# Patient Record
Sex: Female | Born: 1950 | Race: White | Hispanic: No | Marital: Single | Smoking: Never smoker
Health system: Southern US, Community
[De-identification: ages and names within clinical notes are randomized; demographics above are authoritative.]

## PROBLEM LIST (undated history)

## (undated) DIAGNOSIS — E039 Hypothyroidism, unspecified: Secondary | ICD-10-CM

## (undated) DIAGNOSIS — K219 Gastro-esophageal reflux disease without esophagitis: Secondary | ICD-10-CM

## (undated) DIAGNOSIS — IMO0001 Reserved for inherently not codable concepts without codable children: Secondary | ICD-10-CM

## (undated) DIAGNOSIS — F419 Anxiety disorder, unspecified: Secondary | ICD-10-CM

## (undated) DIAGNOSIS — D649 Anemia, unspecified: Secondary | ICD-10-CM

## (undated) DIAGNOSIS — D2371 Other benign neoplasm of skin of right lower limb, including hip: Secondary | ICD-10-CM

## (undated) DIAGNOSIS — F32A Depression, unspecified: Secondary | ICD-10-CM

## (undated) DIAGNOSIS — I10 Essential (primary) hypertension: Secondary | ICD-10-CM

## (undated) DIAGNOSIS — F329 Major depressive disorder, single episode, unspecified: Secondary | ICD-10-CM

## (undated) HISTORY — PX: WISDOM TOOTH EXTRACTION: SHX21

## (undated) HISTORY — DX: Other benign neoplasm of skin of right lower limb, including hip: D23.71

## (undated) HISTORY — PX: ABDOMINAL HYSTERECTOMY: SHX81

## (undated) HISTORY — PX: TONSILLECTOMY: SUR1361

---

## 2007-01-09 ENCOUNTER — Ambulatory Visit: Payer: Self-pay

## 2008-04-01 ENCOUNTER — Ambulatory Visit: Payer: Self-pay | Admitting: Family Medicine

## 2009-07-14 ENCOUNTER — Ambulatory Visit: Payer: Self-pay

## 2009-09-04 ENCOUNTER — Emergency Department: Payer: Self-pay | Admitting: Emergency Medicine

## 2009-09-07 ENCOUNTER — Emergency Department: Payer: Self-pay | Admitting: Emergency Medicine

## 2010-09-17 ENCOUNTER — Emergency Department: Payer: Self-pay | Admitting: Unknown Physician Specialty

## 2010-10-12 ENCOUNTER — Ambulatory Visit: Payer: Self-pay

## 2011-02-15 ENCOUNTER — Ambulatory Visit: Payer: Self-pay | Admitting: Gastroenterology

## 2011-02-17 LAB — PATHOLOGY REPORT

## 2011-10-16 ENCOUNTER — Ambulatory Visit: Payer: Self-pay | Admitting: Nurse Practitioner

## 2012-12-02 ENCOUNTER — Ambulatory Visit: Payer: Self-pay

## 2014-03-12 ENCOUNTER — Ambulatory Visit: Payer: Self-pay | Admitting: Family Medicine

## 2014-09-01 ENCOUNTER — Other Ambulatory Visit: Payer: Self-pay | Admitting: Otolaryngology

## 2014-09-01 DIAGNOSIS — E041 Nontoxic single thyroid nodule: Secondary | ICD-10-CM

## 2014-09-02 ENCOUNTER — Other Ambulatory Visit: Payer: Self-pay | Admitting: Physician Assistant

## 2014-09-03 ENCOUNTER — Ambulatory Visit
Admission: RE | Admit: 2014-09-03 | Discharge: 2014-09-03 | Disposition: A | Discharge status: Home / Self Care | Payer: Medicare Other | Source: Ambulatory Visit | Attending: Otolaryngology | Admitting: Otolaryngology

## 2014-09-03 DIAGNOSIS — E041 Nontoxic single thyroid nodule: Secondary | ICD-10-CM | POA: Insufficient documentation

## 2014-09-03 NOTE — Procedures (Signed)
Interventional Radiology Procedure Note  Procedure:  Left thyroid FNA biopsy  Complications: None  Estimated Blood Loss: <10 mL  Dominant 1.8 cm left thyroid nodule sampled w/ 25 G FNA x 4.  Venetia Night. Kathlene Cote, M.D Pager:  [phone]

## 2014-09-07 LAB — CYTOLOGY - NON PAP

## 2014-11-19 ENCOUNTER — Inpatient Hospital Stay: Admission: RE | Admit: 2014-11-19 | Payer: Medicare Other | Source: Ambulatory Visit

## 2014-11-19 ENCOUNTER — Encounter
Admission: RE | Admit: 2014-11-19 | Discharge: 2014-11-19 | Disposition: A | Discharge status: Home / Self Care | Payer: Medicare Other | Source: Ambulatory Visit | Attending: Otolaryngology | Admitting: Otolaryngology

## 2014-11-19 DIAGNOSIS — E041 Nontoxic single thyroid nodule: Secondary | ICD-10-CM | POA: Insufficient documentation

## 2014-11-19 DIAGNOSIS — Z01818 Encounter for other preprocedural examination: Secondary | ICD-10-CM | POA: Insufficient documentation

## 2014-11-19 HISTORY — DX: Reserved for inherently not codable concepts without codable children: IMO0001

## 2014-11-19 HISTORY — DX: Anemia, unspecified: D64.9

## 2014-11-19 HISTORY — DX: Gastro-esophageal reflux disease without esophagitis: K21.9

## 2014-11-19 LAB — CBC
HCT: 41.9 % (ref 35.0–47.0)
HEMOGLOBIN: 14.3 g/dL (ref 12.0–16.0)
MCH: 28.5 pg (ref 26.0–34.0)
MCHC: 34.2 g/dL (ref 32.0–36.0)
MCV: 83.3 fL (ref 80.0–100.0)
Platelets: 192 10*3/uL (ref 150–440)
RBC: 5.03 MIL/uL (ref 3.80–5.20)
RDW: 14 % (ref 11.5–14.5)
WBC: 6.3 10*3/uL (ref 3.6–11.0)

## 2014-11-19 LAB — POTASSIUM: Potassium: 3.3 mmol/L — ABNORMAL LOW (ref 3.5–5.1)

## 2014-11-19 NOTE — Patient Instructions (Signed)
  Your procedure is scheduled on: December 03, 2014 (Thursday)   Report to Day Surgery.[facility]) To find out your arrival time please call [phone] between 1PM - 3PM on December 02, 2014(Wednesday).  Remember: Instructions that are not followed completely may result in serious medical risk, up to and including death, or upon the discretion of your surgeon and anesthesiologist your surgery may need to be rescheduled.    __x__ 1. Do not eat food or drink liquids after midnight. No gum chewing or hard candies.     ____ 2. No Alcohol for 24 hours before or after surgery.   ____ 3. Bring all medications with you on the day of surgery if instructed.    __x__ 4. Notify your doctor if there is any change in your medical condition     (cold, fever, infections).     Do not wear jewelry, make-up, hairpins, clips or nail polish.  Do not wear lotions, powders, or perfumes. You may wear deodorant.  Do not shave 48 hours prior to surgery. Men may shave face and neck.  Do not bring valuables to the hospital.    [facility] is not responsible for any belongings or valuables.               Contacts, dentures or bridgework may not be worn into surgery.  Leave your suitcase in the car. After surgery it may be brought to your room.  For patients admitted to the hospital, discharge time is determined by your                treatment team.   Patients discharged the day of surgery will not be allowed to drive home.   Please read over the following fact sheets that you were given:      __x__ Take these medicines the morning of surgery with A SIP OF WATER:    1. Ranitidine (Ranitidine at bedtime the night before surgery)   2.   3.   4.  5.  6.  ____ Fleet Enema (as directed)   ____ Use CHG Soap as directed  __x__ Use inhalers on the day of surgery(Use Proventil inhaler the day of surgery and bring to hospital )  ____ Stop metformin 2 days prior to surgery    ____ Take 1/2 of usual  insulin dose the night before surgery and none on the morning of surgery.   __x__ Stop Coumadin/Plavix/aspirin on (Stop Aspirin one week before surgery)  __x_ Stop Anti-inflammatories on (Tylenol ok to take for pain)   __x__ Stop supplements until after surgery.  (Stop Vitamin C, Vitamin E, and Fish Oil NOW)  ____ Bring C-Pap to the hospital.

## 2014-11-20 NOTE — OR Nursing (Signed)
Kt 3.3 called to Dr Pryor Ochoa office and spoke to [place]

## 2014-12-03 ENCOUNTER — Observation Stay
Admission: RE | Admit: 2014-12-03 | Discharge: 2014-12-04 | Disposition: A | Discharge status: Home / Self Care | Payer: Medicare Other | Source: Ambulatory Visit | Attending: Otolaryngology | Admitting: Otolaryngology

## 2014-12-03 ENCOUNTER — Observation Stay
Admission: RE | Admit: 2014-12-03 | Discharge: 2014-12-03 | Disposition: A | Discharge status: Home / Self Care | Payer: Medicare Other | Source: Ambulatory Visit | Attending: Internal Medicine | Admitting: Internal Medicine

## 2014-12-03 ENCOUNTER — Encounter: Payer: Self-pay | Admitting: *Deleted

## 2014-12-03 ENCOUNTER — Ambulatory Visit: Payer: Medicare Other | Admitting: Anesthesiology

## 2014-12-03 ENCOUNTER — Encounter
Admission: RE | Disposition: A | Discharge status: Home / Self Care | Payer: Self-pay | Source: Ambulatory Visit | Attending: Otolaryngology

## 2014-12-03 DIAGNOSIS — E041 Nontoxic single thyroid nodule: Secondary | ICD-10-CM | POA: Diagnosis present

## 2014-12-03 DIAGNOSIS — Z881 Allergy status to other antibiotic agents status: Secondary | ICD-10-CM | POA: Diagnosis not present

## 2014-12-03 DIAGNOSIS — R079 Chest pain, unspecified: Secondary | ICD-10-CM

## 2014-12-03 DIAGNOSIS — Z880 Allergy status to penicillin: Secondary | ICD-10-CM | POA: Insufficient documentation

## 2014-12-03 DIAGNOSIS — Z8349 Family history of other endocrine, nutritional and metabolic diseases: Secondary | ICD-10-CM | POA: Diagnosis not present

## 2014-12-03 DIAGNOSIS — E890 Postprocedural hypothyroidism: Secondary | ICD-10-CM | POA: Diagnosis not present

## 2014-12-03 DIAGNOSIS — K219 Gastro-esophageal reflux disease without esophagitis: Secondary | ICD-10-CM | POA: Insufficient documentation

## 2014-12-03 DIAGNOSIS — Z79899 Other long term (current) drug therapy: Secondary | ICD-10-CM | POA: Diagnosis not present

## 2014-12-03 DIAGNOSIS — Z7982 Long term (current) use of aspirin: Secondary | ICD-10-CM | POA: Insufficient documentation

## 2014-12-03 DIAGNOSIS — Z9889 Other specified postprocedural states: Secondary | ICD-10-CM | POA: Diagnosis not present

## 2014-12-03 DIAGNOSIS — Z809 Family history of malignant neoplasm, unspecified: Secondary | ICD-10-CM | POA: Diagnosis not present

## 2014-12-03 DIAGNOSIS — R9431 Abnormal electrocardiogram [ECG] [EKG]: Secondary | ICD-10-CM | POA: Diagnosis not present

## 2014-12-03 DIAGNOSIS — C73 Malignant neoplasm of thyroid gland: Secondary | ICD-10-CM | POA: Diagnosis not present

## 2014-12-03 DIAGNOSIS — Z7951 Long term (current) use of inhaled steroids: Secondary | ICD-10-CM | POA: Diagnosis not present

## 2014-12-03 HISTORY — PX: THYROIDECTOMY: SHX17

## 2014-12-03 LAB — POCT I-STAT 4, (NA,K, GLUC, HGB,HCT)
GLUCOSE: 95 mg/dL (ref 65–99)
HCT: 42 % (ref 36.0–46.0)
Hemoglobin: 14.3 g/dL (ref 12.0–15.0)
POTASSIUM: 3.5 mmol/L (ref 3.5–5.1)
Sodium: 140 mmol/L (ref 135–145)

## 2014-12-03 LAB — CALCIUM
CALCIUM: 8.3 mg/dL — AB (ref 8.9–10.3)
Calcium: 8.6 mg/dL — ABNORMAL LOW (ref 8.9–10.3)

## 2014-12-03 LAB — MAGNESIUM: MAGNESIUM: 1.9 mg/dL (ref 1.7–2.4)

## 2014-12-03 LAB — ALBUMIN: ALBUMIN: 4.1 g/dL (ref 3.5–5.0)

## 2014-12-03 SURGERY — THYROIDECTOMY
Anesthesia: General | Site: Neck | Wound class: Clean Contaminated

## 2014-12-03 MED ORDER — SODIUM CHLORIDE 0.9 % IR SOLN
Status: DC | PRN
  Administered 2014-12-03: 60 mL

## 2014-12-03 MED ORDER — LISINOPRIL 5 MG PO TABS: 5.0000 mg | ORAL_TABLET | Freq: Every day | ORAL | Status: DC

## 2014-12-03 MED ORDER — FERROUS SULFATE 325 (65 FE) MG PO TABS
325.0000 mg | ORAL_TABLET | Freq: Every day | ORAL | Status: DC
  Administered 2014-12-04: 325 mg via ORAL
  Filled 2014-12-03: qty 1

## 2014-12-03 MED ORDER — MEPERIDINE HCL 25 MG/ML IJ SOLN: 6.2500 mg | INTRAMUSCULAR | Status: DC | PRN

## 2014-12-03 MED ORDER — FLUTICASONE PROPIONATE 50 MCG/ACT NA SUSP
2.0000 | Freq: Every day | NASAL | Status: DC
  Administered 2014-12-04: 2 via NASAL
  Filled 2014-12-03: qty 16

## 2014-12-03 MED ORDER — EPHEDRINE SULFATE 50 MG/ML IJ SOLN
INTRAMUSCULAR | Status: DC | PRN
  Administered 2014-12-03: 5 mg via INTRAVENOUS

## 2014-12-03 MED ORDER — LORATADINE 10 MG PO TABS
10.0000 mg | ORAL_TABLET | Freq: Every day | ORAL | Status: DC
  Administered 2014-12-04: 10 mg via ORAL
  Filled 2014-12-03: qty 1

## 2014-12-03 MED ORDER — LISINOPRIL-HYDROCHLOROTHIAZIDE 10-12.5 MG PO TABS: 0.5000 | ORAL_TABLET | Freq: Every day | ORAL | Status: DC

## 2014-12-03 MED ORDER — PHENYLEPHRINE HCL 10 MG/ML IJ SOLN
INTRAMUSCULAR | Status: DC | PRN
  Administered 2014-12-03 (×2): 100 ug via INTRAVENOUS

## 2014-12-03 MED ORDER — BUPIVACAINE-EPINEPHRINE (PF) 0.25% -1:200000 IJ SOLN
INTRAMUSCULAR | Status: AC
  Filled 2014-12-03: qty 30

## 2014-12-03 MED ORDER — BACITRACIN 50000 UNITS IM SOLR
INTRAMUSCULAR | Status: AC
  Filled 2014-12-03: qty 1

## 2014-12-03 MED ORDER — ACETAMINOPHEN 160 MG/5ML PO SOLN: 650.0000 mg | ORAL | Status: DC | PRN

## 2014-12-03 MED ORDER — CALCIUM CARBONATE-VITAMIN D 500-200 MG-UNIT PO TABS
2.0000 | ORAL_TABLET | Freq: Three times a day (TID) | ORAL | Status: DC
  Administered 2014-12-03 – 2014-12-04 (×3): 2 via ORAL
  Filled 2014-12-03 (×3): qty 2

## 2014-12-03 MED ORDER — LIDOCAINE HCL (CARDIAC) 20 MG/ML IV SOLN
INTRAVENOUS | Status: DC | PRN
  Administered 2014-12-03: 60 mg via INTRAVENOUS

## 2014-12-03 MED ORDER — LISINOPRIL 5 MG PO TABS
5.0000 mg | ORAL_TABLET | Freq: Every day | ORAL | Status: DC
  Administered 2014-12-04: 5 mg via ORAL
  Filled 2014-12-03: qty 1

## 2014-12-03 MED ORDER — ROCURONIUM BROMIDE 100 MG/10ML IV SOLN
INTRAVENOUS | Status: DC | PRN
  Administered 2014-12-03: 5 mg via INTRAVENOUS

## 2014-12-03 MED ORDER — OXYCODONE HCL 5 MG/5ML PO SOLN: 5.0000 mg | ORAL | Status: DC | PRN

## 2014-12-03 MED ORDER — FENTANYL CITRATE (PF) 100 MCG/2ML IJ SOLN: 25.0000 ug | INTRAMUSCULAR | Status: DC | PRN

## 2014-12-03 MED ORDER — ACETAMINOPHEN 650 MG RE SUPP: 650.0000 mg | RECTAL | Status: DC | PRN

## 2014-12-03 MED ORDER — MIDAZOLAM HCL 2 MG/2ML IJ SOLN
INTRAMUSCULAR | Status: DC | PRN
Start: 2014-12-03 — End: 2014-12-03
  Administered 2014-12-03: 2 mg via INTRAVENOUS

## 2014-12-03 MED ORDER — HYDROCHLOROTHIAZIDE 10 MG/ML ORAL SUSPENSION: 6.2500 mg | Freq: Every day | ORAL | Status: DC

## 2014-12-03 MED ORDER — BACITRACIN ZINC 500 UNIT/GM EX OINT
1.0000 "application " | TOPICAL_OINTMENT | Freq: Three times a day (TID) | CUTANEOUS | Status: DC
  Administered 2014-12-03: 1 via TOPICAL
  Administered 2014-12-03: 15.5556 via TOPICAL
  Administered 2014-12-04: 1 via TOPICAL
  Filled 2014-12-03: qty 28.35

## 2014-12-03 MED ORDER — LACTATED RINGERS IV SOLN
INTRAVENOUS | Status: DC | PRN
  Administered 2014-12-03: 07:00:00 via INTRAVENOUS

## 2014-12-03 MED ORDER — FAMOTIDINE 20 MG PO TABS
20.0000 mg | ORAL_TABLET | Freq: Two times a day (BID) | ORAL | Status: DC
  Administered 2014-12-03 – 2014-12-04 (×2): 20 mg via ORAL
  Filled 2014-12-03 (×2): qty 1

## 2014-12-03 MED ORDER — METOCLOPRAMIDE HCL 5 MG/ML IJ SOLN
10.0000 mg | Freq: Once | INTRAMUSCULAR | Status: AC
  Administered 2014-12-03: 10 mg via INTRAVENOUS

## 2014-12-03 MED ORDER — ONDANSETRON HCL 4 MG/2ML IJ SOLN
INTRAMUSCULAR | Status: DC | PRN
  Administered 2014-12-03: 4 mg via INTRAVENOUS

## 2014-12-03 MED ORDER — PROPOFOL 10 MG/ML IV BOLUS
INTRAVENOUS | Status: DC | PRN
  Administered 2014-12-03: 150 mg via INTRAVENOUS

## 2014-12-03 MED ORDER — METOCLOPRAMIDE HCL 5 MG/ML IJ SOLN
INTRAMUSCULAR | Status: AC
  Administered 2014-12-03: 10 mg via INTRAVENOUS
  Filled 2014-12-03: qty 2

## 2014-12-03 MED ORDER — DEXTROSE-NACL 5-0.45 % IV SOLN
INTRAVENOUS | Status: DC
  Administered 2014-12-03 – 2014-12-04 (×3): via INTRAVENOUS

## 2014-12-03 MED ORDER — LISINOPRIL-HYDROCHLOROTHIAZIDE 10-12.5 MG PO TABS
0.5000 | ORAL_TABLET | Freq: Every day | ORAL | Status: DC
  Filled 2014-12-03: qty 1

## 2014-12-03 MED ORDER — ONDANSETRON HCL 4 MG/2ML IJ SOLN
4.0000 mg | INTRAMUSCULAR | Status: DC | PRN
  Administered 2014-12-03 (×2): 4 mg via INTRAVENOUS
  Filled 2014-12-03 (×2): qty 2

## 2014-12-03 MED ORDER — BACITRACIN 500 UNIT/GM EX OINT
TOPICAL_OINTMENT | CUTANEOUS | Status: DC | PRN
  Administered 2014-12-03: 1 via TOPICAL

## 2014-12-03 MED ORDER — SUCCINYLCHOLINE CHLORIDE 20 MG/ML IJ SOLN
INTRAMUSCULAR | Status: DC | PRN
  Administered 2014-12-03: 80 mg via INTRAVENOUS

## 2014-12-03 MED ORDER — FENTANYL CITRATE (PF) 100 MCG/2ML IJ SOLN
INTRAMUSCULAR | Status: DC | PRN
  Administered 2014-12-03 (×4): 50 ug via INTRAVENOUS

## 2014-12-03 MED ORDER — BACITRACIN ZINC 500 UNIT/GM EX OINT
TOPICAL_OINTMENT | CUTANEOUS | Status: AC
  Filled 2014-12-03: qty 0.9

## 2014-12-03 MED ORDER — DOCUSATE SODIUM 100 MG PO CAPS
100.0000 mg | ORAL_CAPSULE | Freq: Two times a day (BID) | ORAL | Status: DC
  Administered 2014-12-03 – 2014-12-04 (×2): 100 mg via ORAL
  Filled 2014-12-03 (×2): qty 1

## 2014-12-03 MED ORDER — MORPHINE SULFATE (PF) 2 MG/ML IV SOLN: 2.0000 mg | INTRAVENOUS | Status: DC | PRN

## 2014-12-03 MED ORDER — DEXAMETHASONE SODIUM PHOSPHATE 4 MG/ML IJ SOLN
INTRAMUSCULAR | Status: DC | PRN
  Administered 2014-12-03: 5 mg via INTRAVENOUS

## 2014-12-03 MED ORDER — BUPIVACAINE-EPINEPHRINE (PF) 0.25% -1:200000 IJ SOLN
INTRAMUSCULAR | Status: DC | PRN
  Administered 2014-12-03: 5 mL

## 2014-12-03 MED ORDER — ALBUTEROL SULFATE (2.5 MG/3ML) 0.083% IN NEBU: 3.0000 mL | INHALATION_SOLUTION | RESPIRATORY_TRACT | Status: DC | PRN

## 2014-12-03 MED ORDER — ONDANSETRON HCL 4 MG PO TABS: 4.0000 mg | ORAL_TABLET | ORAL | Status: DC | PRN

## 2014-12-03 MED ORDER — HYDROCHLOROTHIAZIDE 10 MG/ML ORAL SUSPENSION
6.2500 mg | Freq: Every day | ORAL | Status: DC
  Filled 2014-12-03 (×2): qty 1.25

## 2014-12-03 SURGICAL SUPPLY — 36 items
BLADE SURG 15 STRL LF DISP TIS (BLADE) ×1 IMPLANT
BLADE SURG 15 STRL SS (BLADE) ×2
CANISTER SUCT 1200ML W/VALVE (MISCELLANEOUS) ×3 IMPLANT
CLOSURE WOUND 1/4X4 (GAUZE/BANDAGES/DRESSINGS)
CORD BIP STRL DISP 12FT (MISCELLANEOUS) ×3 IMPLANT
DRAIN TLS ROUND 10FR (DRAIN) IMPLANT
DRAPE MAG INST 16X20 L/F (DRAPES) ×3 IMPLANT
DRSG TEGADERM 2-3/8X2-3/4 SM (GAUZE/BANDAGES/DRESSINGS) IMPLANT
ELECT LARYNGEAL 6/7 (MISCELLANEOUS)
ELECT LARYNGEAL 8/9 (MISCELLANEOUS) ×3
ELECTRODE LARYNGEAL 6/7 (MISCELLANEOUS) IMPLANT
ELECTRODE LARYNGEAL 8/9 (MISCELLANEOUS) ×1 IMPLANT
FORCEPS JEWEL BIP 4-3/4 STR (INSTRUMENTS) ×3 IMPLANT
GLOVE BIO SURGEON STRL SZ7.5 (GLOVE) ×15 IMPLANT
GOWN STRL REUS W/ TWL LRG LVL3 (GOWN DISPOSABLE) ×5 IMPLANT
GOWN STRL REUS W/TWL LRG LVL3 (GOWN DISPOSABLE) ×10
HARMONIC SCALPEL FOCUS (MISCELLANEOUS) ×3 IMPLANT
HEMOSTAT SURGICEL 2X3 (HEMOSTASIS) ×3 IMPLANT
HOOK STAY 5M SHARP BLUNT 3316- (MISCELLANEOUS) IMPLANT
KIT RM TURNOVER STRD PROC AR (KITS) ×3 IMPLANT
LABEL OR SOLS (LABEL) IMPLANT
LIQUID BAND (GAUZE/BANDAGES/DRESSINGS) IMPLANT
NS IRRIG 500ML POUR BTL (IV SOLUTION) ×3 IMPLANT
PACK HEAD/NECK (MISCELLANEOUS) ×3 IMPLANT
PAD GROUND ADULT SPLIT (MISCELLANEOUS) ×3 IMPLANT
PROBE NEUROSIGN BIPOL (MISCELLANEOUS) ×1 IMPLANT
PROBE NEUROSIGN BIPOLAR (MISCELLANEOUS) ×2
SPONGE KITTNER 5P (MISCELLANEOUS) ×9 IMPLANT
SPONGE XRAY 4X4 16PLY STRL (MISCELLANEOUS) ×6 IMPLANT
STRIP CLOSURE SKIN 1/4X4 (GAUZE/BANDAGES/DRESSINGS) IMPLANT
SUT PROLENE 6 0 P 1 18 (SUTURE) IMPLANT
SUT SILK 2 0 (SUTURE) ×2
SUT SILK 2 0 SH (SUTURE) IMPLANT
SUT SILK 2-0 18XBRD TIE 12 (SUTURE) ×1 IMPLANT
SUT VIC AB 4-0 RB1 18 (SUTURE) ×3 IMPLANT
SYSTEM CHEST DRAIN TLS 7FR (DRAIN) IMPLANT

## 2014-12-03 NOTE — Progress Notes (Signed)
..   12/03/2014 12:21 PM  Caitlin Hayes [id number]  Post-Op Day 0    Temp:  [97 F (36.1 C)-98 F (36.7 C)] 97 F (36.1 C) (10/27 1059) Pulse Rate:  [66-94] 81 (10/27 1044) Resp:  [12-18] 16 (10/27 1150) BP: (125-144)/(84-99) 135/85 mmHg (10/27 1044) SpO2:  [96 %-99 %] 99 % (10/27 1059),     Intake/Output Summary (Last 24 hours) at 12/03/14 1221 Last data filed at 12/03/14 0950  Gross per 24 hour  Intake    900 ml  Output    305 ml  Net    595 ml    Results for orders placed or performed during the hospital encounter of 12/03/14 (from the past 24 hour(s))  I-STAT 4, (NA,K, GLUC, HGB,HCT)     Status: None   Collection Time: 12/03/14  6:42 AM  Result Value Ref Range   Sodium 140 135 - 145 mmol/L   Potassium 3.5 3.5 - 5.1 mmol/L   Glucose, Bld 95 65 - 99 mg/dL   HCT 42.0 36.0 - 46.0 %   Hemoglobin 14.3 12.0 - 15.0 g/dL  Calcium     Status: Abnormal   Collection Time: 12/03/14 10:05 AM  Result Value Ref Range   Calcium 8.6 (L) 8.9 - 10.3 mg/dL  Albumin     Status: None   Collection Time: 12/03/14 10:05 AM  Result Value Ref Range   Albumin 4.1 3.5 - 5.0 g/dL    SUBJECTIVE:  No acute events.  Mild sore throat and thirsty.  No breathing difficulty.  No chest pain.  Per PACU EKG, new P-wave inversion from prior EKG.  Recommended medicine evaluation.  Patient denies chest pain or symptoms.  OBJECTIVE:   GEN- NAD CARD- RRR NECK- incision c/d/i  IMPRESSION:  S/p Total thyroidectomy POD#0  PLAN:  1)  Continue to follow calcium levels with recheck at 4p.m. 2)  Continue calcium taper 3)  Hold on synthroid until after pathology back due to suspicious pathology 4)  Per Anesthesiology rec's will place medicine consult for inverted p-waves.  Caitlin Hayes 12/03/2014, 12:21 PM

## 2014-12-03 NOTE — Anesthesia Preprocedure Evaluation (Signed)
Anesthesia Evaluation  Patient identified by MRN, date of birth, ID band Patient awake    Airway Mallampati: III  TM Distance: >3 FB Neck ROM: Full    Dental  (+) Chipped,    Pulmonary asthma ,    breath sounds clear to auscultation       Cardiovascular METS: 3 - Mets  Rhythm:Regular Rate:Normal     Neuro/Psych    GI/Hepatic GERD  ,  Endo/Other    Renal/GU      Musculoskeletal   Abdominal   Peds  Hematology   Anesthesia Other Findings   Reproductive/Obstetrics                             Anesthesia Physical Anesthesia Plan  ASA: II  Anesthesia Plan: General   Post-op Pain Management:    Induction: Intravenous  Airway Management Planned: Oral ETT  Additional Equipment:   Intra-op Plan:   Post-operative Plan: Extubation in OR  Informed Consent: I have reviewed the patients History and Physical, chart, labs and discussed the procedure including the risks, benefits and alternatives for the proposed anesthesia with the patient or authorized representative who has indicated his/her understanding and acceptance.     Plan Discussed with: CRNA and Surgeon  Anesthesia Plan Comments:         Anesthesia Quick Evaluation

## 2014-12-03 NOTE — Transfer of Care (Signed)
Immediate Anesthesia Transfer of Care Note  Patient: Caitlin Hayes  Procedure(s) Performed: Procedure(s): THYROIDECTOMY (N/A)  Patient Location: PACU  Anesthesia Type:General  Level of Consciousness: awake, alert , oriented and patient cooperative  Airway & Oxygen Therapy: Patient Spontanous Breathing and Patient connected to face mask oxygen  Post-op Assessment: Report given to RN, Post -op Vital signs reviewed and stable and Patient moving all extremities X 4  Post vital signs: Reviewed and stable  Last Vitals:  Filed Vitals:   12/03/14 0944  BP: 144/99  Pulse: 83  Temp: 36.2 C  Resp: 16    Complications: No apparent anesthesia complications

## 2014-12-03 NOTE — Op Note (Signed)
....12/03/2014  9:25 AM    [name]  [id number]   Pre-Op Dx: THYROID NODULE  Post-op Dx: SAME  Proc: Total Thyroidectomy with Laryngeal Nerve Monitoring  Surg: Carloyn Manner  Assistant: Beverly Gust  Anes: GOT  EBL: <10ccs  Comp: None   Indications:Multinodular goiter with Left sided dominant thyroid nodule with compressive symptoms  Findings:Bilateral recurrent laryngeal nerves identified and preserved, left superior and inferior parathyroid glands identified and preserved.  Right superior and inferior parathyroid glands identified and preserved.  Significant amount of capsular inflammation and thickened noted on right.  Description of Procedure: After the patient was identified in hold and the history and physical and consent was reviewed and updated. The patient was marked in an upright position on the anterior neck along a natural occuring skin crease. The patient was next taken to the operating room and placed in a supine position. General endotracheal anesthesia was induced with laryngeal monitor endotracheal tube.  Direct visualization by the surgeon of the tube electrodes in contact with the vocal cords was made.. The patient's anterior marked neck crease was neck injected with 7cc's of 0.5% marcaine with 1:200,000 Epinephrine. The patient was next prepped and draped in a sterile normal fashion.  At this time, a 15 blade scalpel was used to make a skin incision along a previously marked anterior neck crease. Dissection was carefully performed through the subcutaneous tissues with combination of Bovie electrocautery and blunt dissection.  The platysma was incised and anterior neck veins ligated with harmonic scalpel.  The median raphe of the strap muscles was divided in a linear fashion with Bovie electrocautery until the anterior border of the thyroid gland was identified.     Attention at this time was directed to the patient's right side. The  sternohyoid muscle was bluntly dissected away from the right hemithyroid.  The lateral border of the thyroid and the carotid artery was identified.  Dissection bluntly and with Bovie electrocautery was continued superiorly and inferiorly along the lateral edge of the thyroid.  The superior vessels were identified laterally and then medially with blunt dissection in Joel's space between the larynx and the superior thyroid pole.  Further dissection was continued inferiorly as well.  Once the superior pole was pedicled, the superior thyroid vessels were ligated with Harmonic Scalpel.  The large right hemithyroid was delivered from the wound after further dissection inferior and superiorly.   Once delivered from the wound revealed Berry's ligament and the nodule of Zuckerkandl.  Just beneath this nodule, the recurrent laryngeal nerve was identified and stimulated robustly with movement of the arytenoid joint.  The inferior parathyroid was identified adjacent to the nerve along with the superior parathyroid as well.  These were identified with good vascularization.  The nerve was tracked until its insertion into the larynx.  Next, the remaining attachments of Berry's ligament was divided and the right hemithyroid gland was completed.  Attention at this time was directed to the patient's left side. The sternohyoid muscle was bluntly dissected away from the large left thyroid gland.  The lateral border of the thyroid and the carotid artery was identified.  Dissection bluntly and with Bovie electrocautery was continued superiorly and inferiorly along the lateral edge of the thyroid.  The superior vessels were identified laterally and then medially with blunt dissection in Joel's space between the larynx and the superior thyroid pole.  Further dissection was continued inferiorly as well.  Once the superior pole was pedicled, the superior thyroid vessels were ligated with Harmonic  Scalpel.  The large left thyroid was  delivered from the wound after it was enlarged approximately 1cm on the left.  The large left thyroid nodule once delivered from the wound revealed Berry's ligament and the nodule of Zuckerkandl.  Just beneath this nodule, the recurrent laryngeal nerve was identified and stimulated robustly with movement of the arytenoid joint.  The inferior parathyroid was identified adjacent to the nerve along with the superior parathyroid as well.  These were identified and the left inferior parathyroid gland was noted to have poor vascularization.  The nerve was tracked until its insertion into the larynx.  Next, the remaining attachments of Berry's ligament was divided and the left thyroid gland was completed  There remaining attachments of the total thyroid were separated from the larynx using bipolar and harmonic scalpel.  The gland was marked on the superior pole of the patient's right thyroid gland with a marking stitch and passed off the table for permanent evaluation.  The wound was copiously irrigated with sterile saline. Meticulous hemostasis with bipolar was obtained.  Visualization of an intact left and right recurrent laryngeal nerves was made and these stimulated robustly.  The parathyroid glands were intact and well perfused. Surgicel was placed in the wound bed bilaterally. The wound was then closed in a multilayered fashion with vicryl for subcutaneous tissues and locking running 6.0 prolene suture for the skin closure.  This was topped with Bacitracin ointment.  At this time the patient was extubated and taken to PACU in good condition.  Plan: Admit for observation and calcium.  Follow pathology.  Limit activity for 2 weeks. Follow up next week for post-operative evaluation and suture removal.  Follow calcium levels.  Begin synthroid.  Calcium taper.  Caitlin Hayes  12/03/2014 9:25 AM

## 2014-12-03 NOTE — Anesthesia Postprocedure Evaluation (Signed)
  Anesthesia Post-op Note  Patient: Caitlin Hayes  Procedure(s) Performed: Procedure(s): THYROIDECTOMY (N/A)   Anesthesia type:General  Patient location: PACU  Post pain: Pain level controlled  Post assessment: Post-op Vital signs reviewed, Patient's Cardiovascular Status Stable, Respiratory Function Stable, Patent Airway and No signs of Nausea or vomiting  Post vital signs: Reviewed and stable  Last Vitals:  Filed Vitals:   12/03/14 1059  BP:   Pulse:   Temp: 36.1 C  Resp:     Level of consciousness: awake, alert  and patient cooperative  Complications: callled by PACU nurse, pt has inverted P waves in rhythm strip, pt asymptomatic, BP stable 140/80 HR 86 . 12 lead EKG done, read: abnormal P rhythm. 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  D/W Dr Janeann Forehand, and Dr Pryor Ochoa. Will have hospitalist see pt Reviewed old EKG from [place] from 05/2014, shows same pattern

## 2014-12-03 NOTE — Progress Notes (Signed)
*  PRELIMINARY RESULTS* Echocardiogram 2D Echocardiogram has been performed.  Caitlin Hayes 12/03/2014, 8:03 PM

## 2014-12-03 NOTE — H&P (Signed)
..  History and Physical paper copy reviewed and updated date of procedure and will be scanned into system.  

## 2014-12-03 NOTE — Anesthesia Procedure Notes (Signed)
Procedure Name: Intubation Date/Time: 12/03/2014 7:30 AM Performed by: Silvana Newness Pre-anesthesia Checklist: Patient identified, Emergency Drugs available, Suction available, Patient being monitored and Timeout performed Patient Re-evaluated:Patient Re-evaluated prior to inductionOxygen Delivery Method: Circle system utilized Preoxygenation: Pre-oxygenation with 100% oxygen Intubation Type: IV induction Ventilation: Mask ventilation without difficulty Laryngoscope Size: Glidescope and 3 (LoPros3 used on glidescope for proper placement of nerve monitoring) Grade View: Grade I Tube type: Oral Tube size: 7.0 mm Number of attempts: 1 Airway Equipment and Method: Rigid stylet Placement Confirmation: ETT inserted through vocal cords under direct vision,  positive ETCO2 and breath sounds checked- equal and bilateral Secured at: 21 cm Tube secured with: Tape Dental Injury: Teeth and Oropharynx as per pre-operative assessment  Comments: Recurrent laryngeal nerve monitoring being used.  Glidescope used to confirm proper placement of ETT.  Surgeon agreed on placement

## 2014-12-03 NOTE — Progress Notes (Signed)
..   12/03/2014 4:53 PM  Caitlin Hayes [id number]  Post-Op Day 0    Temp:  [97 F (36.1 C)-98 F (36.7 C)] 97 F (36.1 C) (10/27 1059) Pulse Rate:  [66-94] 81 (10/27 1044) Resp:  [12-18] 16 (10/27 1150) BP: (125-144)/(84-99) 135/85 mmHg (10/27 1044) SpO2:  [96 %-99 %] 99 % (10/27 1059) Weight:  [70.563 kg (155 lb 9 oz)] 70.563 kg (155 lb 9 oz) (10/27 1326),     Intake/Output Summary (Last 24 hours) at 12/03/14 1653 Last data filed at 12/03/14 1635  Gross per 24 hour  Intake    900 ml  Output    830 ml  Net     70 ml    Results for orders placed or performed during the hospital encounter of 12/03/14 (from the past 24 hour(s))  I-STAT 4, (NA,K, GLUC, HGB,HCT)     Status: None   Collection Time: 12/03/14  6:42 AM  Result Value Ref Range   Sodium 140 135 - 145 mmol/L   Potassium 3.5 3.5 - 5.1 mmol/L   Glucose, Bld 95 65 - 99 mg/dL   HCT 42.0 36.0 - 46.0 %   Hemoglobin 14.3 12.0 - 15.0 g/dL  Calcium     Status: Abnormal   Collection Time: 12/03/14 10:05 AM  Result Value Ref Range   Calcium 8.6 (L) 8.9 - 10.3 mg/dL  Albumin     Status: None   Collection Time: 12/03/14 10:05 AM  Result Value Ref Range   Albumin 4.1 3.5 - 5.0 g/dL  Calcium     Status: Abnormal   Collection Time: 12/03/14  3:35 PM  Result Value Ref Range   Calcium 8.3 (L) 8.9 - 10.3 mg/dL    SUBJECTIVE:  No acute events.  Being evaluated by medicine due to inverted P waves.  Calcium slightly decreased this p.m. Vomitted lunch 1 hour ago about 400cc's.  Not given Zofran yet.  OBJECTIVE:   GEN- NAD Neck- incision c/d/i Neuro- neg chovstek's  IMPRESSION:  S/p total thyroidectomy POD#0  PLAN:  1)  Defer to medicine for evaluation of inverted p waves, patient denies current chest pain 2)  Due to slight decrease in calcium levels, will follow up in a.m. For repeat levels.  Continue TID calcium replacement 3)  Anticipate d/c tomorrow from ENT standpoint 4)  Zofran for nausea  [name] 12/03/2014,  4:53 PM

## 2014-12-03 NOTE — Consult Note (Addendum)
Caitlin Hayes at [place] NAME: Caitlin Hayes    MR#:  [id number]  DATE OF BIRTH:  Jun 20, 1950  DATE OF ADMISSION:  12/03/2014  PRIMARY CARE PHYSICIAN: Marguerita Merles, MD   REQUESTING/REFERRING PHYSICIAN: Dr. Carloyn Manner, MD  CHIEF COMPLAINT:  No chief complaint on file.   reason for consult : Abnormal EKG  HISTORY OF PRESENT ILLNESS:  Caitlin Hayes  is a 65 y.o. female with a known history of thyroid nodule GERD and anemia.  the patient had a history of sciatica nodule. She got thyroidectomy today. Per PACU EKG, new P-wave inversion, which is different from previous EKG. Dr. Carloyn Manner requested medical consult. The patient denies any chest pain, palpitation, orthopnea or nocturnal dyspnea, no leg edema.   PAST MEDICAL HISTORY:   Past Medical History  Diagnosis Date  . Shortness of breath dyspnea     with exertion  . GERD (gastroesophageal reflux disease)   . Anemia     PAST SURGICAL HISTOIRY:   Past Surgical History  Procedure Laterality Date  . Tonsillectomy    . Abdominal hysterectomy    . Wisdom tooth extraction    . Thyroidectomy N/A 12/03/2014    Procedure: THYROIDECTOMY;  Surgeon: Carloyn Manner, MD;  Location: [facility] ORS;  Service: ENT;  Laterality: N/A;    SOCIAL HISTORY:   Social History  Substance Use Topics  . Smoking status: Never Smoker   . Smokeless tobacco: Never Used  . Alcohol Use: No    FAMILY HISTORY:  History reviewed. No pertinent family history. father and sister had cancer; mother and sister has thyroid disease.  DRUG ALLERGIES:   Allergies  Allergen Reactions  . Levaquin [Levofloxacin] Other (See Comments)    "feel like I can't walk"  . Penicillins Rash  . Ciprofloxacin Nausea Only  . Erythromycin Nausea Only    REVIEW OF SYSTEMS:  CONSTITUTIONAL: No fever, fatigue or weakness.  EYES: No blurred or double vision.  EARS, NOSE, AND THROAT: No tinnitus or ear pain.  RESPIRATORY: No  cough, shortness of breath, wheezing or hemoptysis.  CARDIOVASCULAR: No chest pain, orthopnea, edema.  GASTROINTESTINAL: No nausea, vomiting, diarrhea or abdominal pain.  GENITOURINARY: No dysuria, hematuria.  ENDOCRINE: No polyuria, nocturia,  HEMATOLOGY: No anemia, easy bruising or bleeding SKIN: No rash or lesion. MUSCULOSKELETAL: No joint pain or arthritis.   NEUROLOGIC: No tingling, numbness, weakness.  PSYCHIATRY: No anxiety or depression.   MEDICATIONS AT HOME:   Prior to Admission medications   Medication Sig Start Date End Date Taking? Authorizing Provider  albuterol (PROVENTIL HFA;VENTOLIN HFA) 108 (90 BASE) MCG/ACT inhaler Inhale into the lungs every 4 (four) hours as needed for wheezing or shortness of breath.   Yes Historical Provider, MD  alendronate (FOSAMAX) 70 MG tablet Take 70 mg by mouth once a week. Take with a full glass of water on an empty stomach.   Yes Historical Provider, MD  aspirin EC 81 MG tablet Take 81 mg by mouth daily.   Yes Historical Provider, MD  calcium-vitamin D (OSCAL WITH D) 500-200 MG-UNIT per tablet Take 2 tablets by mouth daily.    Yes Historical Provider, MD  ferrous sulfate (FEOSOL) 325 (65 FE) MG tablet Take 325 mg by mouth daily with breakfast.   Yes Historical Provider, MD  Fish Oil-Cholecalciferol (FISH OIL + D3) 1000-1000 MG-UNIT CAPS Take 1,200 mg by mouth daily.    Yes Historical Provider, MD  fluticasone (FLONASE) 50 MCG/ACT nasal spray Place 2  sprays into both nostrils daily.   Yes Historical Provider, MD  folic acid (FOLVITE) 161 MCG tablet Take 800 mcg by mouth daily.    Yes Historical Provider, MD  lisinopril-hydrochlorothiazide (PRINZIDE,ZESTORETIC) 10-12.5 MG per tablet Take 0.5 tablets by mouth daily.   Yes Historical Provider, MD  loratadine (CLARITIN) 10 MG tablet Take 10 mg by mouth daily.   Yes Historical Provider, MD  ranitidine (ZANTAC) 75 MG tablet Take 75 mg by mouth daily.   Yes Historical Provider, MD  vitamin C (ASCORBIC  ACID) 500 MG tablet Take 500 mg by mouth daily.   Yes Historical Provider, MD  vitamin E 200 UNIT capsule Take 200 Units by mouth daily.   Yes Historical Provider, MD      VITAL SIGNS:  Blood pressure 135/85, pulse 81, temperature 97 F (36.1 C), temperature source Oral, resp. rate 16, height 5' 6.75" (1.695 m), weight 70.563 kg (155 lb 9 oz), SpO2 99 %.  PHYSICAL EXAMINATION:  GENERAL:  64 y.o.-year-old patient lying in the bed with no acute distress.  EYES: Pupils equal, round, reactive to light and accommodation. No scleral icterus. Extraocular muscles intact.  HEENT: Head atraumatic, normocephalic. Oropharynx and nasopharynx clear.  moist oral mucosa. Surgical wound with sutures  and mild swelling in front of the neck.  NECK:  Supple, no jugular venous distention. No thyroid enlargement, no tenderness.  LUNGS: Normal breath sounds bilaterally, no wheezing, rales,rhonchi or crepitation. No use of accessory muscles of respiration.  CARDIOVASCULAR: S1, S2 normal. No murmurs, rubs, or gallops.  ABDOMEN: Soft, nontender, nondistended. Bowel sounds present. No organomegaly or mass.  EXTREMITIES: No pedal edema, cyanosis, or clubbing.  NEUROLOGIC: Cranial nerves II through XII are intact. Muscle strength 5/5 in all extremities. Sensation intact. Gait not checked.  PSYCHIATRIC: The patient is alert and oriented x 3.  SKIN: No obvious rash, lesion, or ulcer.   LABORATORY PANEL:   CBC  Recent Labs Lab 12/03/14 0642  HGB 14.3  HCT 42.0   ------------------------------------------------------------------------------------------------------------------  Chemistries   Recent Labs Lab 12/03/14 0642  12/03/14 1535  NA 140  --   --   K 3.5  --   --   GLUCOSE 95  --   --   CALCIUM  --   < > 8.3*  < > = values in this interval not displayed. ------------------------------------------------------------------------------------------------------------------  Cardiac Enzymes No results  for input(s): TROPONINI in the last 168 hours. ------------------------------------------------------------------------------------------------------------------  RADIOLOGY:  No results found.  EKG:   Orders placed or performed during the hospital encounter of 12/03/14  . ED EKG  . ED EKG    IMPRESSION AND PLAN:   Abnormal EKG Thyroid nodule, status post thyroidectomy   GERD    Recommendation:  follow-uphocardiogram to evaluate cardiac function. Follow up BMP and magnesium level. cardiology consult when necessary.  All the records are reviewed and case discussed with Consulting provider. Management plans discussed with the patient, family and they are in agreement.  CODE STATUS: Full code  TOTAL TIME TAKING CARE OF THIS PATIENT: 45 minutes.    Demetrios Loll M.D on 12/03/2014 at 4:52 PM  Between 7am to 6pm - Pager - [phone]  After 6pm go to [email or web address] - password [email or web address] [facility]  Office  [phone]  CC: Primary care Physician: Marguerita Merles, MD

## 2014-12-04 LAB — BASIC METABOLIC PANEL
ANION GAP: 9 (ref 5–15)
BUN: 13 mg/dL (ref 6–20)
CALCIUM: 9.1 mg/dL (ref 8.9–10.3)
CO2: 27 mmol/L (ref 22–32)
Chloride: 105 mmol/L (ref 101–111)
Creatinine, Ser: 0.61 mg/dL (ref 0.44–1.00)
GFR calc Af Amer: >60 mL/min (ref >60)
Glucose, Bld: 128 mg/dL — ABNORMAL HIGH (ref 65–99)
POTASSIUM: 3.6 mmol/L (ref 3.5–5.1)
Sodium: 141 mmol/L (ref 135–145)

## 2014-12-04 LAB — CBC
HEMATOCRIT: 37.6 % (ref 35.0–47.0)
HEMOGLOBIN: 12.9 g/dL (ref 12.0–16.0)
MCH: 28.3 pg (ref 26.0–34.0)
MCHC: 34.3 g/dL (ref 32.0–36.0)
MCV: 82.5 fL (ref 80.0–100.0)
Platelets: 191 10*3/uL (ref 150–440)
RBC: 4.56 MIL/uL (ref 3.80–5.20)
RDW: 14.1 % (ref 11.5–14.5)
WBC: 11.9 10*3/uL — AB (ref 3.6–11.0)

## 2014-12-04 MED ORDER — CALCIUM CARBONATE-VITAMIN D 500-200 MG-UNIT PO TABS: 2.0000 | ORAL_TABLET | Freq: Three times a day (TID) | ORAL | Status: AC

## 2014-12-04 MED ORDER — BACITRACIN ZINC 500 UNIT/GM EX OINT: 1.0000 "application " | TOPICAL_OINTMENT | Freq: Three times a day (TID) | CUTANEOUS | Status: AC

## 2014-12-04 MED ORDER — DOCUSATE SODIUM 100 MG PO CAPS: 100.0000 mg | ORAL_CAPSULE | Freq: Two times a day (BID) | ORAL | Status: AC

## 2014-12-04 MED ORDER — ONDANSETRON HCL 4 MG PO TABS: 4.0000 mg | ORAL_TABLET | ORAL | Status: AC | PRN

## 2014-12-04 NOTE — Progress Notes (Addendum)
Patient seen and examined Vitals reviewed  No CP/SOB. No exertional symptoms.  EKG looks similar to EKG April 2016 at [facility].  Echo shows normal EF but some minimal akinesis. No immediate would up needed.  Will need f/u with Dr. Humphrey Rolls of cardiology at discharge.  Will sign off. Thank you. Please call with questions.  20 minutes spent with patient >50% time spent in discussing results, follow up at bedside  Pager [phone]

## 2014-12-04 NOTE — Progress Notes (Signed)
Pt alert and oriented. VSS. IV site removed. Concerns addressed. Discharge summary given to patient. Prescriptions given to patient.

## 2014-12-04 NOTE — Progress Notes (Signed)
..   12/04/2014 7:51 AM  Caitlin Hayes [id number]  Post-Op Day 1    Temp:  [97 F (36.1 C)-98.6 F (37 C)] 98.2 F (36.8 C) (10/28 0729) Pulse Rate:  [63-94] 63 (10/28 0729) Resp:  [12-28] 18 (10/28 0729) BP: (94-144)/(47-99) 124/65 mmHg (10/28 0729) SpO2:  [94 %-99 %] 99 % (10/28 0729) Weight:  [70.563 kg (155 lb 9 oz)] 70.563 kg (155 lb 9 oz) (10/27 1326),     Intake/Output Summary (Last 24 hours) at 12/04/14 0751 Last data filed at 12/04/14 0700  Gross per 24 hour  Intake   2651 ml  Output   1880 ml  Net    771 ml    Results for orders placed or performed during the hospital encounter of 12/03/14 (from the past 24 hour(s))  Calcium     Status: Abnormal   Collection Time: 12/03/14 10:05 AM  Result Value Ref Range   Calcium 8.6 (L) 8.9 - 10.3 mg/dL  Albumin     Status: None   Collection Time: 12/03/14 10:05 AM  Result Value Ref Range   Albumin 4.1 3.5 - 5.0 g/dL  Calcium     Status: Abnormal   Collection Time: 12/03/14  3:35 PM  Result Value Ref Range   Calcium 8.3 (L) 8.9 - 10.3 mg/dL  Magnesium     Status: None   Collection Time: 12/03/14  8:33 PM  Result Value Ref Range   Magnesium 1.9 1.7 - 2.4 mg/dL  Basic metabolic panel     Status: Abnormal   Collection Time: 12/04/14  3:44 AM  Result Value Ref Range   Sodium 141 135 - 145 mmol/L   Potassium 3.6 3.5 - 5.1 mmol/L   Chloride 105 101 - 111 mmol/L   CO2 27 22 - 32 mmol/L   Glucose, Bld 128 (H) 65 - 99 mg/dL   BUN 13 6 - 20 mg/dL   Creatinine, Ser 0.61 0.44 - 1.00 mg/dL   Calcium 9.1 8.9 - 10.3 mg/dL   GFR calc non Af Amer >60 >60 mL/min   GFR calc Af Amer >60 >60 mL/min   Anion gap 9 5 - 15  CBC     Status: Abnormal   Collection Time: 12/04/14  3:44 AM  Result Value Ref Range   WBC 11.9 (H) 3.6 - 11.0 K/uL   RBC 4.56 3.80 - 5.20 MIL/uL   Hemoglobin 12.9 12.0 - 16.0 g/dL   HCT 37.6 35.0 - 47.0 %   MCV 82.5 80.0 - 100.0 fL   MCH 28.3 26.0 - 34.0 pg   MCHC 34.3 32.0 - 36.0 g/dL   RDW 14.1 11.5 - 14.5  %   Platelets 191 150 - 440 K/uL    SUBJECTIVE:  Improved nausea this a.m.  Wants to get up and walk around.  SCDs in place.  Had echocardiogram last night with results pending.  No difficulty breathing.  Pain controlled.  OBJECTIVE:  Gen- NAD, alert and oriented sitting upright in bed Neck- incision c/d/i  IMPRESSION:  S/p Total thyroidectomy POD#1 doing well  PLAN:  Normal calciums this a.m.  Will continue taper but no need to check further.  OK to discharge from surgical perspective once increases fluid intake without n/v.  Will follow Echo results.  Appreciate Medicine consultation.  Alexyss Balzarini 12/04/2014, 7:51 AM

## 2014-12-07 LAB — SURGICAL PATHOLOGY

## 2016-05-24 ENCOUNTER — Other Ambulatory Visit: Payer: Self-pay | Admitting: Family Medicine

## 2016-05-24 DIAGNOSIS — Z1239 Encounter for other screening for malignant neoplasm of breast: Secondary | ICD-10-CM

## 2016-12-20 ENCOUNTER — Other Ambulatory Visit: Payer: Self-pay | Admitting: Family Medicine

## 2016-12-20 DIAGNOSIS — Z1231 Encounter for screening mammogram for malignant neoplasm of breast: Secondary | ICD-10-CM

## 2017-01-18 ENCOUNTER — Other Ambulatory Visit: Payer: Self-pay | Admitting: Family Medicine

## 2017-01-18 DIAGNOSIS — Z13820 Encounter for screening for osteoporosis: Secondary | ICD-10-CM

## 2017-03-14 ENCOUNTER — Ambulatory Visit: Admit: 2017-03-14 | Payer: Medicare Other | Admitting: Internal Medicine

## 2017-03-14 SURGERY — COLONOSCOPY WITH PROPOFOL
Anesthesia: General

## 2017-10-11 ENCOUNTER — Ambulatory Visit
Admission: RE | Admit: 2017-10-11 | Discharge: 2017-10-11 | Disposition: A | Discharge status: Home / Self Care | Payer: Medicare Other | Source: Ambulatory Visit | Attending: Family Medicine | Admitting: Family Medicine

## 2017-10-11 DIAGNOSIS — Z780 Asymptomatic menopausal state: Secondary | ICD-10-CM | POA: Diagnosis not present

## 2017-10-11 DIAGNOSIS — M85851 Other specified disorders of bone density and structure, right thigh: Secondary | ICD-10-CM | POA: Diagnosis not present

## 2017-10-11 DIAGNOSIS — Z13820 Encounter for screening for osteoporosis: Secondary | ICD-10-CM | POA: Diagnosis present

## 2017-10-11 DIAGNOSIS — Z1231 Encounter for screening mammogram for malignant neoplasm of breast: Secondary | ICD-10-CM | POA: Insufficient documentation

## 2018-01-23 DIAGNOSIS — D2371 Other benign neoplasm of skin of right lower limb, including hip: Secondary | ICD-10-CM

## 2018-01-23 HISTORY — DX: Other benign neoplasm of skin of right lower limb, including hip: D23.71

## 2018-02-18 ENCOUNTER — Other Ambulatory Visit: Payer: Self-pay

## 2018-02-18 DIAGNOSIS — Z1211 Encounter for screening for malignant neoplasm of colon: Secondary | ICD-10-CM

## 2018-02-19 ENCOUNTER — Telehealth: Payer: Self-pay | Admitting: Gastroenterology

## 2018-02-19 NOTE — Telephone Encounter (Signed)
Patient called and left a message on the answering machine states she was talking with Selinda Eon & schedule a colonoscopy with DR Allen Norris on 03-05-2018 and would like a call back.

## 2018-02-19 NOTE — Telephone Encounter (Signed)
Returned patients call.  We reviewed her medication list.  I advised her to stop taking her Ferrous Sulfate 5 days prior to her colonoscopy.  Advised her that she may continue with her current prescription meds up until the day of her procedure and on day of procedure to hold her meds until after the procedure.  Asked her to call me back if she had anymore questions.  Thanks [employer]

## 2018-02-28 ENCOUNTER — Telehealth: Payer: Self-pay | Admitting: Gastroenterology

## 2018-02-28 NOTE — Telephone Encounter (Signed)
Patients call has been returned.  We've reviewed her dietary requirements for colonoscopy.  I've asked her to call me back if she has any other questions or concerns.  Thanks [employer]

## 2018-02-28 NOTE — Telephone Encounter (Signed)
Patient called & set up a colonoscopy with Sharyn Lull and would like to speak to her she has a few questions.

## 2018-03-05 ENCOUNTER — Ambulatory Visit
Admission: RE | Admit: 2018-03-05 | Discharge: 2018-03-05 | Disposition: A | Discharge status: Home / Self Care | Payer: Medicare Other | Attending: Gastroenterology | Admitting: Gastroenterology

## 2018-03-05 ENCOUNTER — Ambulatory Visit: Payer: Medicare Other | Admitting: Certified Registered"

## 2018-03-05 ENCOUNTER — Encounter
Admission: RE | Disposition: A | Discharge status: Home / Self Care | Payer: Self-pay | Source: Home / Self Care | Attending: Gastroenterology

## 2018-03-05 ENCOUNTER — Encounter: Payer: Self-pay | Admitting: *Deleted

## 2018-03-05 DIAGNOSIS — K640 First degree hemorrhoids: Secondary | ICD-10-CM | POA: Diagnosis not present

## 2018-03-05 DIAGNOSIS — Z79899 Other long term (current) drug therapy: Secondary | ICD-10-CM | POA: Insufficient documentation

## 2018-03-05 DIAGNOSIS — Z860100 Personal history of colon polyps, unspecified: Secondary | ICD-10-CM

## 2018-03-05 DIAGNOSIS — Z1211 Encounter for screening for malignant neoplasm of colon: Secondary | ICD-10-CM

## 2018-03-05 DIAGNOSIS — I10 Essential (primary) hypertension: Secondary | ICD-10-CM | POA: Diagnosis not present

## 2018-03-05 DIAGNOSIS — Z7951 Long term (current) use of inhaled steroids: Secondary | ICD-10-CM | POA: Diagnosis not present

## 2018-03-05 DIAGNOSIS — E039 Hypothyroidism, unspecified: Secondary | ICD-10-CM | POA: Insufficient documentation

## 2018-03-05 DIAGNOSIS — D123 Benign neoplasm of transverse colon: Secondary | ICD-10-CM

## 2018-03-05 DIAGNOSIS — D124 Benign neoplasm of descending colon: Secondary | ICD-10-CM

## 2018-03-05 DIAGNOSIS — Z7983 Long term (current) use of bisphosphonates: Secondary | ICD-10-CM | POA: Insufficient documentation

## 2018-03-05 DIAGNOSIS — K635 Polyp of colon: Secondary | ICD-10-CM | POA: Diagnosis not present

## 2018-03-05 DIAGNOSIS — Z09 Encounter for follow-up examination after completed treatment for conditions other than malignant neoplasm: Secondary | ICD-10-CM | POA: Diagnosis present

## 2018-03-05 DIAGNOSIS — Z7982 Long term (current) use of aspirin: Secondary | ICD-10-CM | POA: Insufficient documentation

## 2018-03-05 DIAGNOSIS — Z79890 Hormone replacement therapy (postmenopausal): Secondary | ICD-10-CM | POA: Insufficient documentation

## 2018-03-05 HISTORY — PX: COLONOSCOPY WITH PROPOFOL: SHX5780

## 2018-03-05 HISTORY — DX: Essential (primary) hypertension: I10

## 2018-03-05 HISTORY — DX: Anxiety disorder, unspecified: F41.9

## 2018-03-05 HISTORY — DX: Major depressive disorder, single episode, unspecified: F32.9

## 2018-03-05 HISTORY — DX: Depression, unspecified: F32.A

## 2018-03-05 HISTORY — DX: Hypothyroidism, unspecified: E03.9

## 2018-03-05 SURGERY — COLONOSCOPY WITH PROPOFOL
Anesthesia: General

## 2018-03-05 MED ORDER — SODIUM CHLORIDE 0.9 % IV SOLN
INTRAVENOUS | Status: DC
  Administered 2018-03-05: 1000 mL via INTRAVENOUS

## 2018-03-05 MED ORDER — PROPOFOL 10 MG/ML IV BOLUS
INTRAVENOUS | Status: AC
  Filled 2018-03-05: qty 40

## 2018-03-05 MED ORDER — PROPOFOL 10 MG/ML IV BOLUS
INTRAVENOUS | Status: DC | PRN
  Administered 2018-03-05: 50 mg via INTRAVENOUS

## 2018-03-05 MED ORDER — PROPOFOL 500 MG/50ML IV EMUL
INTRAVENOUS | Status: DC | PRN
  Administered 2018-03-05: 150 ug/kg/min via INTRAVENOUS

## 2018-03-05 NOTE — H&P (Signed)
Lucilla Lame, MD [address] [place], [place] [address] Phone:[phone] Fax : [phone]  Primary Care Physician:  Marguerita Merles, MD Primary Gastroenterologist:  Dr. Allen Norris  Pre-Procedure History & Physical: HPI:  Caitlin Hayes is a 68 y.o. female is here for an colonoscopy.   Past Medical History:  Diagnosis Date  . Anemia   . Anxiety   . Depression   . GERD (gastroesophageal reflux disease)   . Hypertension   . Hypothyroidism   . Shortness of breath dyspnea    with exertion    Past Surgical History:  Procedure Laterality Date  . ABDOMINAL HYSTERECTOMY    . COLONOSCOPY WITH PROPOFOL    . THYROIDECTOMY N/A 12/03/2014   Procedure: THYROIDECTOMY;  Surgeon: Carloyn Manner, MD;  Location: [facility] ORS;  Service: ENT;  Laterality: N/A;  . TONSILLECTOMY    . WISDOM TOOTH EXTRACTION      Prior to Admission medications   Medication Sig Start Date End Date Taking? Authorizing Provider  albuterol (PROVENTIL HFA;VENTOLIN HFA) 108 (90 BASE) MCG/ACT inhaler Inhale into the lungs every 4 (four) hours as needed for wheezing or shortness of breath.   Yes [provider]  levothyroxine (SYNTHROID, LEVOTHROID) 88 MCG tablet Take 88 mcg by mouth daily before breakfast.   Yes [provider]  alendronate (FOSAMAX) 70 MG tablet Take 70 mg by mouth once a week. Take with a full glass of water on an empty stomach.    [provider]  aspirin EC 81 MG tablet Take 81 mg by mouth daily.    [provider]  bacitracin ointment Apply 1 application topically every 8 (eight) hours. 12/04/14   Vaught, Jeannie Fend, MD  calcium-vitamin D (OSCAL WITH D) 500-200 MG-UNIT tablet Take 2 tablets by mouth 3 (three) times daily. 12/04/14   Carloyn Manner, MD  docusate sodium (COLACE) 100 MG capsule Take 1 capsule (100 mg total) by mouth 2 (two) times daily. 12/04/14   Vaught, Jeannie Fend, MD  fluticasone (FLONASE) 50 MCG/ACT nasal spray Place 2 sprays into both  nostrils daily.    [provider]  folic acid (FOLVITE) 629 MCG tablet Take 800 mcg by mouth daily.     [provider]  lisinopril-hydrochlorothiazide (PRINZIDE,ZESTORETIC) 10-12.5 MG per tablet Take 0.5 tablets by mouth daily.    [provider]  loratadine (CLARITIN) 10 MG tablet Take 10 mg by mouth daily.    [provider]  ondansetron (ZOFRAN) 4 MG tablet Take 1 tablet (4 mg total) by mouth every 4 (four) hours as needed for nausea. 12/04/14   Carloyn Manner, MD  vitamin C (ASCORBIC ACID) 500 MG tablet Take 500 mg by mouth daily.    [provider]    Allergies as of 02/18/2018 - Review Complete 12/03/2014  Allergen Reaction Noted  . Levaquin [levofloxacin] Other (See Comments) 09/03/2014  . Penicillins Rash 09/03/2014  . Ciprofloxacin Nausea Only 09/03/2014  . Erythromycin Nausea Only 09/03/2014    Family History  Problem Relation Age of Onset  . Breast cancer Neg Hx     Social History   Socioeconomic History  . Marital status: Single    Spouse name: Not on file  . Number of children: Not on file  . Years of education: Not on file  . Highest education level: Not on file  Occupational History  . Not on file  Social Needs  . Financial resource strain: Not on file  . Food insecurity:    Worry: Not on  file    Inability: Not on file  . Transportation needs:    Medical: Not on file    Non-medical: Not on file  Tobacco Use  . Smoking status: Never Smoker  . Smokeless tobacco: Never Used  Substance and Sexual Activity  . Alcohol use: No  . Drug use: No  . Sexual activity: Not on file  Lifestyle  . Physical activity:    Days per week: Not on file    Minutes per session: Not on file  . Stress: Not on file  Relationships  . Social connections:    Talks on phone: Not on file    Gets together: Not on file    Attends religious service: Not on file    Active member of club or organization: Not on file    Attends  meetings of clubs or organizations: Not on file    Relationship status: Not on file  . Intimate partner violence:    Fear of current or ex partner: Not on file    Emotionally abused: Not on file    Physically abused: Not on file    Forced sexual activity: Not on file  Other Topics Concern  . Not on file  Social History Narrative  . Not on file    Review of Systems: See HPI, otherwise negative ROS  Physical Exam: BP (!) 141/98   Pulse 78   Temp 97.8 F (36.6 C) (Tympanic)   Resp 18   Ht 5' 6.5" (1.689 m)   Wt 70.3 kg   SpO2 97%   BMI 24.64 kg/m  General:   Alert,  pleasant and cooperative in NAD Head:  Normocephalic and atraumatic. Neck:  Supple; no masses or thyromegaly. Lungs:  Clear throughout to auscultation.    Heart:  Regular rate and rhythm. Abdomen:  Soft, nontender and nondistended. Normal bowel sounds, without guarding, and without rebound.   Neurologic:  Alert and  oriented x4;  grossly normal neurologically.  Impression/Plan: [name] is here for an colonoscopy to be performed for history of colon polyps 11/22/2014  Risks, benefits, limitations, and alternatives regarding  colonoscopy have been reviewed with the patient.  Questions have been answered.  All parties agreeable.   Lucilla Lame, MD  03/05/2018, 8:37 AM

## 2018-03-05 NOTE — Op Note (Signed)
[facility] Gastroenterology Patient Name: Caitlin Hayes Procedure Date: 03/05/2018 8:37 AM MRN: [id number] Account #: [id number] Date of Birth: 1950-03-21 Admit Type: Outpatient Age: 68 Room: [facility] ENDO ROOM 4 Gender: Female Note Status: Finalized Procedure:            Colonoscopy Indications:          High risk colon cancer surveillance: Personal history                        of colonic polyps Providers:            Lucilla Lame MD, MD Referring MD:         Marguerita Merles, MD (Referring MD) Medicines:            Propofol per Anesthesia Complications:        No immediate complications. Procedure:            Pre-Anesthesia Assessment:                       - Prior to the procedure, a History and Physical was                        performed, and patient medications and allergies were                        reviewed. The patient's tolerance of previous                        anesthesia was also reviewed. The risks and benefits of                        the procedure and the sedation options and risks were                        discussed with the patient. All questions were                        answered, and informed consent was obtained. Prior                        Anticoagulants: The patient has taken no previous                        anticoagulant or antiplatelet agents. ASA Grade                        Assessment: II - A patient with mild systemic disease.                        After reviewing the risks and benefits, the patient was                        deemed in satisfactory condition to undergo the                        procedure.                       After obtaining informed consent, the colonoscope was  passed under direct vision. Throughout the procedure,                        the patient's blood pressure, pulse, and oxygen                        saturations were monitored continuously. The                        Colonoscope was  introduced through the anus and                        advanced to the the cecum, identified by appendiceal                        orifice and ileocecal valve. The colonoscopy was                        performed without difficulty. The patient tolerated the                        procedure well. The quality of the bowel preparation                        was excellent. Findings:      The perianal and digital rectal examinations were normal.      A 3 mm polyp was found in the transverse colon. The polyp was sessile.       The polyp was removed with a cold biopsy forceps. Resection and       retrieval were complete.      A 3 mm polyp was found in the descending colon. The polyp was sessile.       The polyp was removed with a cold biopsy forceps. Resection and       retrieval were complete.      Non-bleeding internal hemorrhoids were found during retroflexion. The       hemorrhoids were Grade I (internal hemorrhoids that do not prolapse). Impression:           - One 3 mm polyp in the transverse colon, removed with                        a cold biopsy forceps. Resected and retrieved.                       - One 3 mm polyp in the descending colon, removed with                        a cold biopsy forceps. Resected and retrieved.                       - Non-bleeding internal hemorrhoids. Recommendation:       - Discharge patient to home.                       - Resume previous diet.                       - Continue present medications.                       -  Await pathology results.                       - Repeat colonoscopy in 5 years for surveillance. Procedure Code(s):    --- Professional ---                       [phone], Colonoscopy, flexible; with biopsy, single or                        multiple Diagnosis Code(s):    --- Professional ---                       Z86.010, Personal history of colonic polyps                       D12.3, Benign neoplasm of transverse colon (hepatic                         flexure or splenic flexure)                       D12.4, Benign neoplasm of descending colon CPT copyright 2018 American Medical Association. All rights reserved. The codes documented in this report are preliminary and upon coder review may  be revised to meet current compliance requirements. Lucilla Lame MD, MD 03/05/2018 8:55:12 AM This report has been signed electronically. Number of Addenda: 0 Note Initiated On: 03/05/2018 8:37 AM Scope Withdrawal Time: 0 hours 6 minutes 5 seconds  Total Procedure Duration: 0 hours 9 minutes 27 seconds       [facility]

## 2018-03-05 NOTE — Anesthesia Postprocedure Evaluation (Signed)
Anesthesia Post Note  Patient: Caitlin Hayes  Procedure(s) Performed: COLONOSCOPY WITH PROPOFOL (N/A )  Patient location during evaluation: Endoscopy Anesthesia Type: General Level of consciousness: awake and alert and oriented Pain management: pain level controlled Vital Signs Assessment: post-procedure vital signs reviewed and stable Respiratory status: spontaneous breathing, nonlabored ventilation and respiratory function stable Cardiovascular status: blood pressure returned to baseline and stable Postop Assessment: no signs of nausea or vomiting Anesthetic complications: no     Last Vitals:  Vitals:   03/05/18 0918 03/05/18 0928  BP: 109/74 112/68  Pulse: 66 (!) 56  Resp: 16 (!) 31  Temp:    SpO2: 100% 100%    Last Pain:  Vitals:   03/05/18 0928  TempSrc:   PainSc: 0-No pain                 Kacelyn Rowzee

## 2018-03-05 NOTE — Transfer of Care (Signed)
Immediate Anesthesia Transfer of Care Note  Patient: Caitlin Hayes  Procedure(s) Performed: COLONOSCOPY WITH PROPOFOL (N/A )  Patient Location: Endoscopy Unit  Anesthesia Type:General  Level of Consciousness: awake, alert  and oriented  Airway & Oxygen Therapy: Patient Spontanous Breathing and Patient connected to nasal cannula oxygen  Post-op Assessment: Report given to RN and Post -op Vital signs reviewed and stable  Post vital signs: Reviewed and stable  Last Vitals:  Vitals Value Taken Time  BP    Temp    Pulse    Resp    SpO2      Last Pain:  Vitals:   03/05/18 0755  TempSrc: Tympanic  PainSc: 0-No pain         Complications: No apparent anesthesia complications

## 2018-03-05 NOTE — Anesthesia Post-op Follow-up Note (Signed)
Anesthesia QCDR form completed.        

## 2018-03-05 NOTE — Anesthesia Preprocedure Evaluation (Signed)
Anesthesia Evaluation  Patient identified by MRN, date of birth, ID band Patient awake    Reviewed: Allergy & Precautions, NPO status , Patient's Chart, lab work & pertinent test results  History of Anesthesia Complications Negative for: history of anesthetic complications  Airway Mallampati: III  TM Distance: >3 FB Neck ROM: Full    Dental  (+) Chipped, Poor Dentition   Pulmonary neg pulmonary ROS, neg sleep apnea, neg COPD,    breath sounds clear to auscultation- rhonchi (-) wheezing      Cardiovascular Exercise Tolerance: Good hypertension, Pt. on medications (-) CAD, (-) Past MI, (-) Cardiac Stents and (-) CABG  Rhythm:Regular Rate:Normal - Systolic murmurs and - Diastolic murmurs    Neuro/Psych neg Seizures PSYCHIATRIC DISORDERS Anxiety Depression negative neurological ROS     GI/Hepatic Neg liver ROS, GERD  ,  Endo/Other  neg diabetesHypothyroidism   Renal/GU negative Renal ROS     Musculoskeletal negative musculoskeletal ROS (+)   Abdominal (+) - obese,   Peds  Hematology  (+) anemia ,   Anesthesia Other Findings Past Medical History: No date: Anemia No date: Anxiety No date: Depression No date: GERD (gastroesophageal reflux disease) No date: Hypertension No date: Hypothyroidism No date: Shortness of breath dyspnea     Comment:  with exertion   Reproductive/Obstetrics                             Anesthesia Physical Anesthesia Plan  ASA: II  Anesthesia Plan: General   Post-op Pain Management:    Induction: Intravenous  PONV Risk Score and Plan: 2 and Propofol infusion  Airway Management Planned: Natural Airway  Additional Equipment:   Intra-op Plan:   Post-operative Plan:   Informed Consent: I have reviewed the patients History and Physical, chart, labs and discussed the procedure including the risks, benefits and alternatives for the proposed anesthesia with  the patient or authorized representative who has indicated his/her understanding and acceptance.     Dental advisory given  Plan Discussed with: CRNA and Anesthesiologist  Anesthesia Plan Comments:         Anesthesia Quick Evaluation

## 2018-03-06 ENCOUNTER — Encounter: Payer: Self-pay | Admitting: Gastroenterology

## 2018-03-06 LAB — SURGICAL PATHOLOGY

## 2018-03-07 ENCOUNTER — Encounter: Payer: Self-pay | Admitting: Gastroenterology

## 2018-03-18 ENCOUNTER — Telehealth: Payer: Self-pay | Admitting: Gastroenterology

## 2018-03-18 NOTE — Telephone Encounter (Signed)
Yes as the letter that was sent to her states she should have a repeat colonoscopy in 5 years.

## 2018-03-18 NOTE — Telephone Encounter (Signed)
I am not sure if I understand. yes the pathology clearly says colon polyp, descending cold biopsy with superficial serrated changes.  If you read the bottom it says it is either a hyperplastic polyp or possibly an early serrated adenomatous polyp.  Both are polyps.

## 2018-03-18 NOTE — Telephone Encounter (Signed)
Is this still a 5 year repeat? I was confused where it said "the differential diagnosis could include a hyperplastic polyp, or possibly an early serrated adenoma.

## 2018-03-18 NOTE — Telephone Encounter (Signed)
Pt is calling for clarification. We mailed a letter and you only stated the "polyp". Please review this pathology result dated for 03/05/18 and advise me on the 2nd polyp removed.   B. COLON POLYP, DESCENDING; COLD BIOPSY  - BENIGN COLONIC MUCOSA WITH SUPERFICIAL SERRATED CHANGES.  - NEGATIVE FOR DYSPLASIA AND MALIGNANCY.   Note: Multiple additional deeper recut levels were examined. The  differential diagnosis could include a hyperplastic polyp, or possibly  an early serrated adenoma.

## 2018-03-18 NOTE — Telephone Encounter (Signed)
Patient had a colonoscopy 03-05-2018 with Dr Allen Norris on the report given to her she had 2 biopsies. She received her report through the mail & it only shows the result for one polyp. Patient would like clarification.

## 2018-03-19 NOTE — Telephone Encounter (Signed)
Contacted pt and advised of Colonoscopy results.

## 2018-05-15 DIAGNOSIS — D2371 Other benign neoplasm of skin of right lower limb, including hip: Secondary | ICD-10-CM

## 2019-05-30 ENCOUNTER — Other Ambulatory Visit: Payer: Self-pay | Admitting: Family Medicine

## 2019-05-30 DIAGNOSIS — Z1231 Encounter for screening mammogram for malignant neoplasm of breast: Secondary | ICD-10-CM

## 2020-01-06 ENCOUNTER — Other Ambulatory Visit: Payer: Self-pay | Admitting: Family Medicine

## 2020-01-06 DIAGNOSIS — Z13820 Encounter for screening for osteoporosis: Secondary | ICD-10-CM

## 2020-02-16 ENCOUNTER — Ambulatory Visit
Admission: RE | Admit: 2020-02-16 | Discharge: 2020-02-16 | Disposition: A | Discharge status: Home / Self Care | Payer: Medicare Other | Source: Ambulatory Visit | Attending: Family Medicine | Admitting: Family Medicine

## 2020-02-16 ENCOUNTER — Other Ambulatory Visit: Payer: Self-pay

## 2020-02-16 DIAGNOSIS — M85851 Other specified disorders of bone density and structure, right thigh: Secondary | ICD-10-CM | POA: Insufficient documentation

## 2020-02-16 DIAGNOSIS — Z13820 Encounter for screening for osteoporosis: Secondary | ICD-10-CM | POA: Insufficient documentation

## 2020-02-16 DIAGNOSIS — Z780 Asymptomatic menopausal state: Secondary | ICD-10-CM | POA: Diagnosis not present

## 2020-02-16 DIAGNOSIS — Z1231 Encounter for screening mammogram for malignant neoplasm of breast: Secondary | ICD-10-CM | POA: Diagnosis present

## 2020-04-17 IMAGING — MG MM DIGITAL SCREENING BILAT W/ TOMO W/ CAD
8 series · 8 of 24 positions shown · non-contrast
Comparison: Previous exam(s).

CLINICAL DATA: Screening.

EXAM:
DIGITAL SCREENING BILATERAL MAMMOGRAM WITH TOMO AND CAD

[R CC synth-2D]
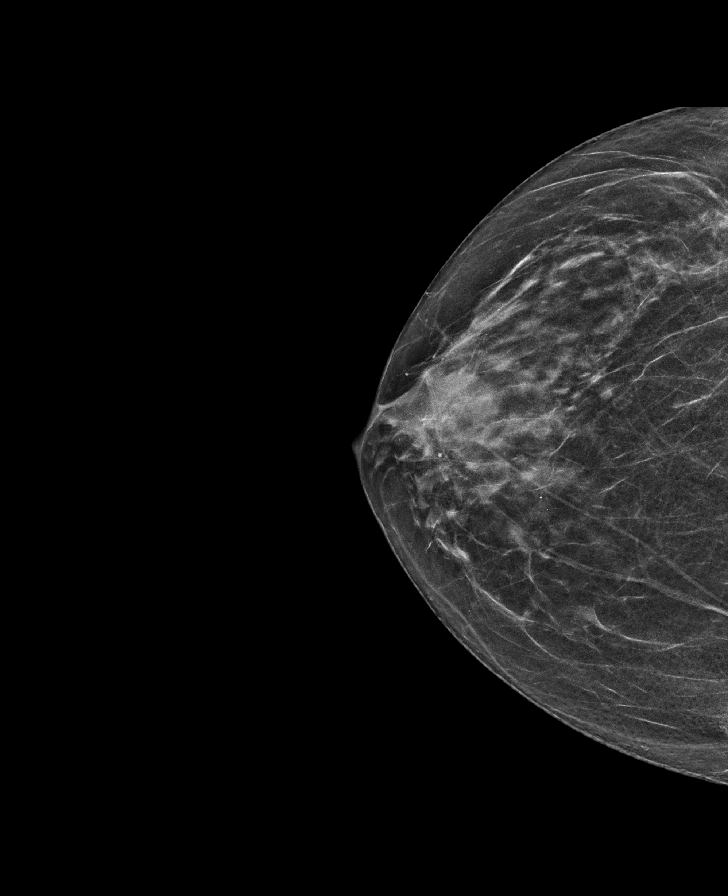

[L MLO synth-2D]
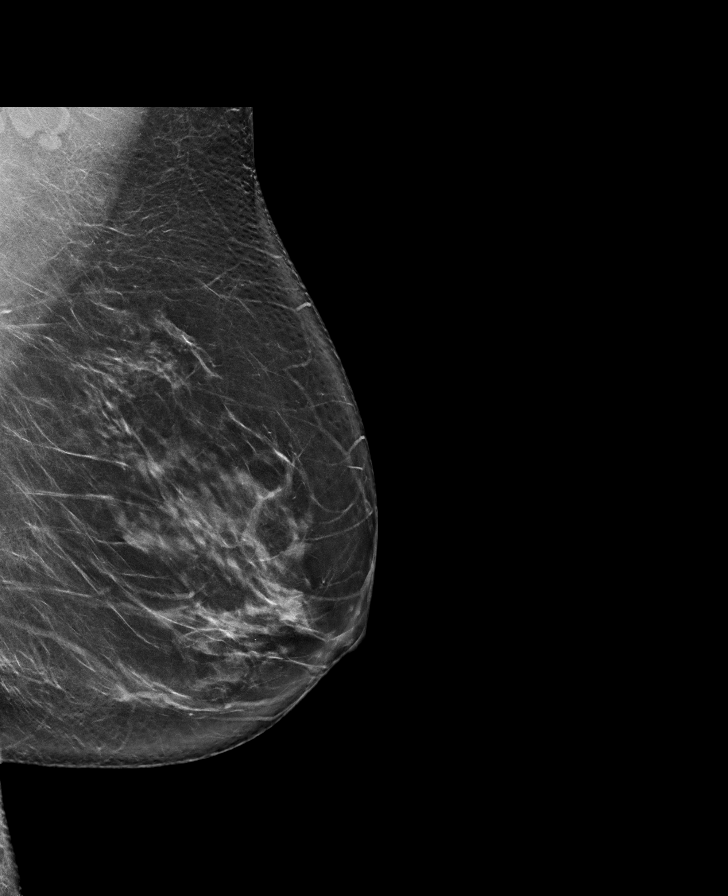

[R MLO synth-2D]
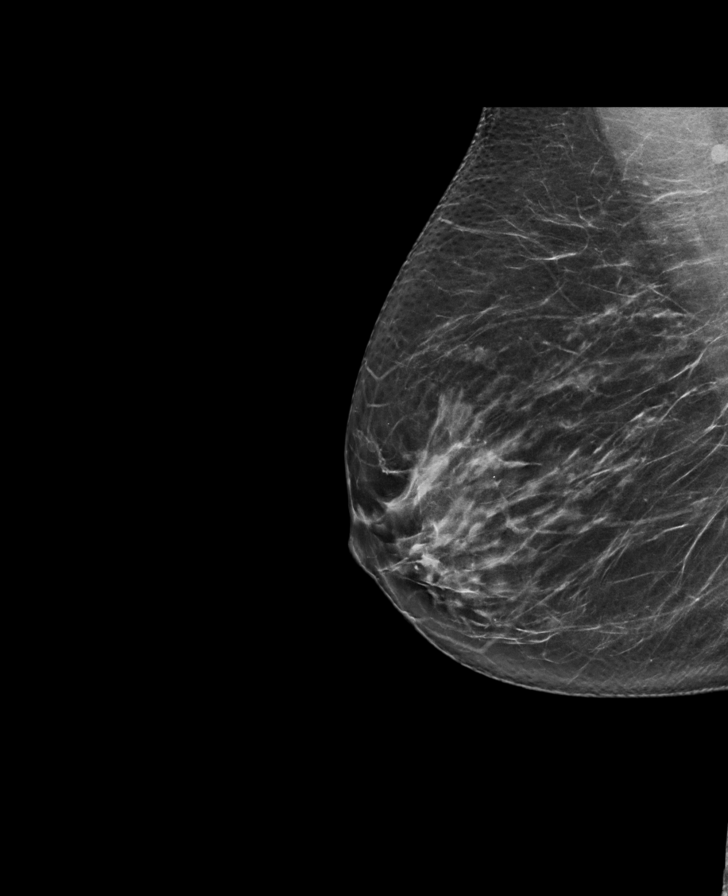

[L CC synth-2D]
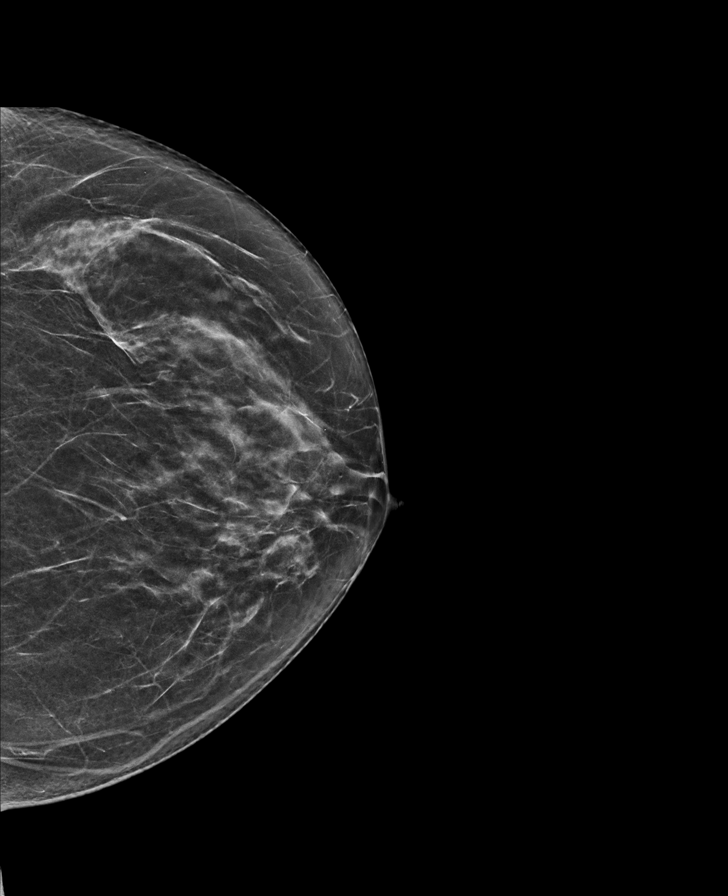

[L MLO tomo · tomo slice 42/83.0]
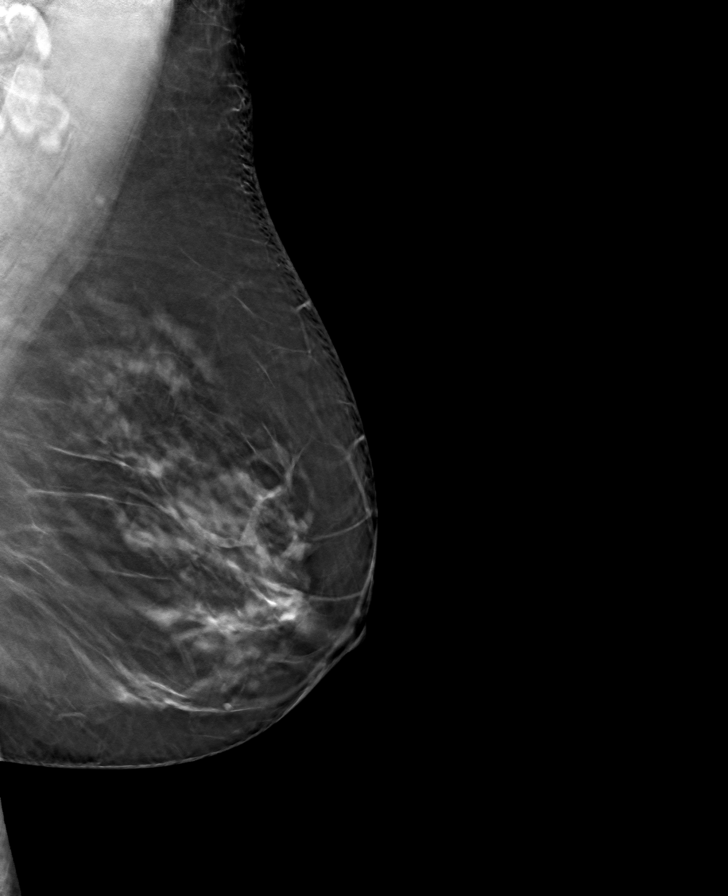

[L CC tomo · tomo slice 33/65.0]
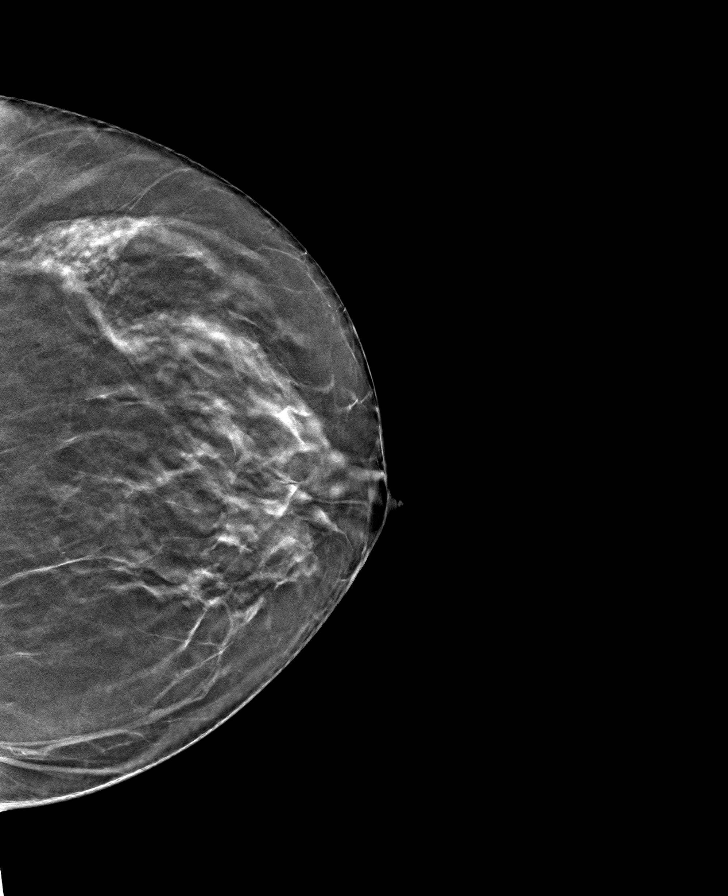

[R MLO tomo · tomo slice 35/68.0]
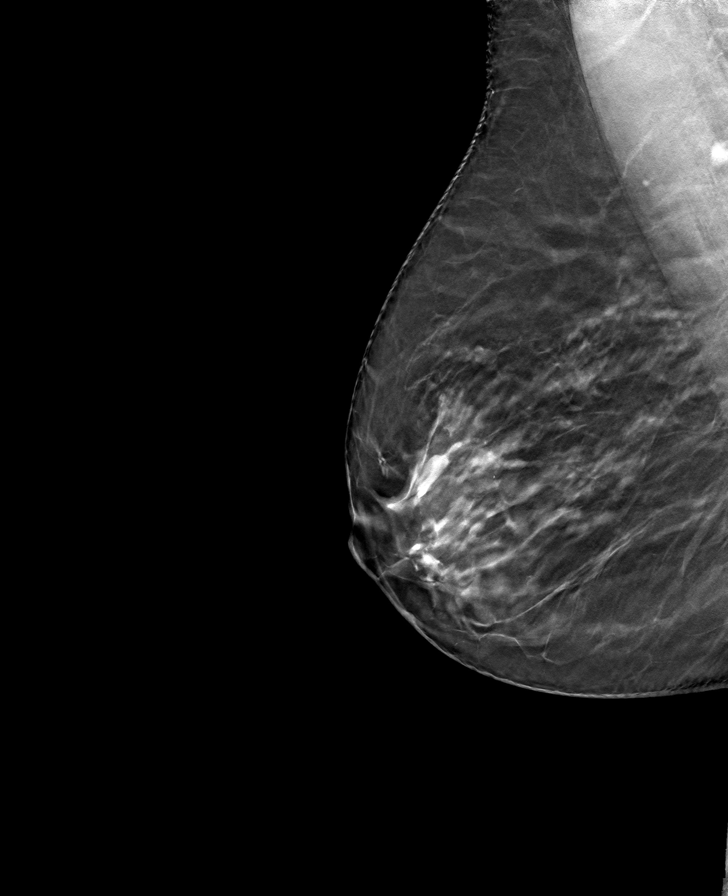

[R CC tomo · tomo slice 28/55.0]
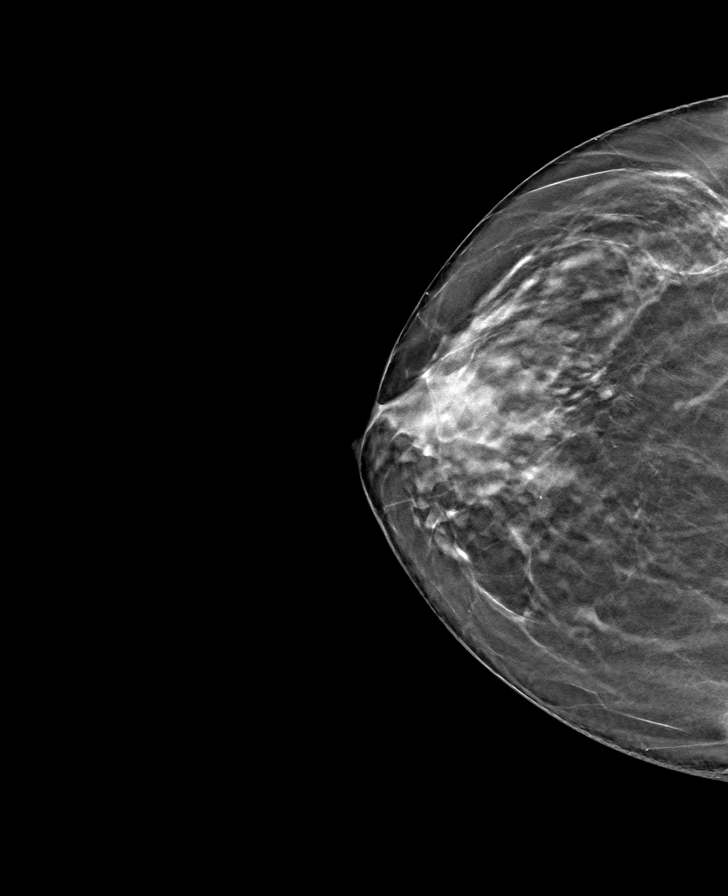

[8 of 24 positions shown; findings below may reference images not displayed]

ACR Breast Density Category c: The breast tissue is heterogeneously
dense, which may obscure small masses.
FINDINGS: There are no findings suspicious for malignancy. Images were
processed with CAD.
IMPRESSION: No mammographic evidence of malignancy. A result letter of this
screening mammogram will be mailed directly to the patient.

RECOMMENDATION:
Screening mammogram in one year. (Code:FT-U-LHB)

BI-RADS CATEGORY  1: Negative.

## 2021-09-29 ENCOUNTER — Other Ambulatory Visit: Payer: Self-pay | Admitting: Family Medicine

## 2021-09-29 DIAGNOSIS — Z1231 Encounter for screening mammogram for malignant neoplasm of breast: Secondary | ICD-10-CM

## 2022-12-25 ENCOUNTER — Other Ambulatory Visit: Payer: Self-pay | Admitting: Family Medicine

## 2022-12-25 DIAGNOSIS — Z1231 Encounter for screening mammogram for malignant neoplasm of breast: Secondary | ICD-10-CM

## 2023-06-20 ENCOUNTER — Ambulatory Visit
Admission: RE | Admit: 2023-06-20 | Discharge: 2023-06-20 | Disposition: A | Discharge status: Home / Self Care | Source: Ambulatory Visit | Attending: Family Medicine | Admitting: Family Medicine

## 2023-06-20 DIAGNOSIS — Z1231 Encounter for screening mammogram for malignant neoplasm of breast: Secondary | ICD-10-CM | POA: Insufficient documentation
